# Patient Record
Sex: Female | Born: 1965 | Race: White | Hispanic: No | Smoking: Never smoker
Health system: Southern US, Community
[De-identification: ages and names within clinical notes are randomized; demographics above are authoritative.]

## PROBLEM LIST (undated history)

## (undated) DIAGNOSIS — J449 Chronic obstructive pulmonary disease, unspecified: Secondary | ICD-10-CM

## (undated) DIAGNOSIS — J45909 Unspecified asthma, uncomplicated: Secondary | ICD-10-CM

---

## 2018-01-23 ENCOUNTER — Encounter (HOSPITAL_COMMUNITY): Payer: Self-pay | Admitting: Behavioral Health

## 2018-01-23 ENCOUNTER — Inpatient Hospital Stay (HOSPITAL_COMMUNITY)
Admission: AD | Admit: 2018-01-23 | Discharge: 2018-01-26 | DRG: 885 | Disposition: A | Discharge status: Home / Self Care | Payer: Federal, State, Local not specified - Other | Source: Other Acute Inpatient Hospital | Attending: Psychiatry | Admitting: Psychiatry

## 2018-01-23 ENCOUNTER — Other Ambulatory Visit: Payer: Self-pay

## 2018-01-23 DIAGNOSIS — F419 Anxiety disorder, unspecified: Secondary | ICD-10-CM | POA: Diagnosis present

## 2018-01-23 DIAGNOSIS — Z7722 Contact with and (suspected) exposure to environmental tobacco smoke (acute) (chronic): Secondary | ICD-10-CM | POA: Diagnosis present

## 2018-01-23 DIAGNOSIS — Z818 Family history of other mental and behavioral disorders: Secondary | ICD-10-CM

## 2018-01-23 DIAGNOSIS — G4700 Insomnia, unspecified: Secondary | ICD-10-CM | POA: Diagnosis present

## 2018-01-23 DIAGNOSIS — Z880 Allergy status to penicillin: Secondary | ICD-10-CM

## 2018-01-23 DIAGNOSIS — J449 Chronic obstructive pulmonary disease, unspecified: Secondary | ICD-10-CM | POA: Diagnosis present

## 2018-01-23 DIAGNOSIS — Z79899 Other long term (current) drug therapy: Secondary | ICD-10-CM | POA: Diagnosis not present

## 2018-01-23 DIAGNOSIS — Z881 Allergy status to other antibiotic agents status: Secondary | ICD-10-CM | POA: Diagnosis not present

## 2018-01-23 DIAGNOSIS — F332 Major depressive disorder, recurrent severe without psychotic features: Principal | ICD-10-CM | POA: Diagnosis present

## 2018-01-23 DIAGNOSIS — Z560 Unemployment, unspecified: Secondary | ICD-10-CM

## 2018-01-23 DIAGNOSIS — T50902A Poisoning by unspecified drugs, medicaments and biological substances, intentional self-harm, initial encounter: Secondary | ICD-10-CM

## 2018-01-23 DIAGNOSIS — F4310 Post-traumatic stress disorder, unspecified: Secondary | ICD-10-CM | POA: Diagnosis present

## 2018-01-23 DIAGNOSIS — R059 Cough, unspecified: Secondary | ICD-10-CM

## 2018-01-23 DIAGNOSIS — R05 Cough: Secondary | ICD-10-CM

## 2018-01-23 DIAGNOSIS — Z915 Personal history of self-harm: Secondary | ICD-10-CM

## 2018-01-23 HISTORY — DX: Unspecified asthma, uncomplicated: J45.909

## 2018-01-23 HISTORY — DX: Chronic obstructive pulmonary disease, unspecified: J44.9

## 2018-01-23 MED ORDER — HYDROXYZINE HCL 25 MG PO TABS
25.0000 mg | ORAL_TABLET | Freq: Four times a day (QID) | ORAL | Status: DC | PRN
  Filled 2018-01-23: qty 10

## 2018-01-23 MED ORDER — ACETAMINOPHEN 325 MG PO TABS
650.0000 mg | ORAL_TABLET | Freq: Four times a day (QID) | ORAL | Status: DC | PRN
  Administered 2018-01-24: 650 mg via ORAL
  Filled 2018-01-23: qty 2

## 2018-01-23 MED ORDER — MAGNESIUM HYDROXIDE 400 MG/5ML PO SUSP: 30.0000 mL | Freq: Every day | ORAL | Status: DC | PRN

## 2018-01-23 MED ORDER — TRAZODONE HCL 50 MG PO TABS
50.0000 mg | ORAL_TABLET | Freq: Every evening | ORAL | Status: DC | PRN
  Filled 2018-01-23: qty 7

## 2018-01-23 MED ORDER — ALUM & MAG HYDROXIDE-SIMETH 200-200-20 MG/5ML PO SUSP: 30.0000 mL | ORAL | Status: DC | PRN

## 2018-01-23 NOTE — Progress Notes (Signed)
D: Pt was in dayroom upon initial approach.  Pt presents with depressed affect and mood.  Pt reports she is feeling "dizzy."  Her goal is "just to get cleaned up."  Pt denies SI/HI, denies hallucinations, denies pain.  Pt was encouraged to get some rest in her room because she was feeling dizzy.  She was excused from evening group.    A: Introduced self to pt.  Actively listened to pt and offered support and encouragement. Fall prevention techniques reviewed with pt and pt verbalized understanding.  BP and pulse checked see flowsheet.  PO fluids encouraged.  Q15 minute safety checks maintained.  R: Pt is safe on the unit.  Pt verbally contracts for safety.  Will continue to monitor and assess.

## 2018-01-23 NOTE — Tx Team (Signed)
Initial Treatment Plan 01/23/2018 6:13 PM Kirsten FreezeRosa Lee Stanley ZOX:[id number]    PATIENT STRESSORS: Loss of a child Marital or family conflict   PATIENT STRENGTHS: Ability for insight Capable of independent living   PATIENT IDENTIFIED PROBLEMS: "to change the way I look at life"  "to understand things a little better".                    DISCHARGE CRITERIA:  Ability to meet basic life and health needs Adequate post-discharge living arrangements Improved stabilization in mood, thinking, and/or behavior  PRELIMINARY DISCHARGE PLAN: Attend aftercare/continuing care group Attend PHP/IOP  PATIENT/FAMILY INVOLVEMENT: This treatment plan has been presented to and reviewed with the patient, Kirsten Stanley, and/or family member.  The patient and family have been given the opportunity to ask questions and make suggestions.  Layla BarterWhite, Janoah Menna L, RN 01/23/2018, 6:13 PM
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
## 2018-01-24 ENCOUNTER — Ambulatory Visit (HOSPITAL_COMMUNITY)
Admission: AD | Admit: 2018-01-24 | Discharge: 2018-01-24 | Disposition: A | Discharge status: Home / Self Care | Payer: Federal, State, Local not specified - Other | Source: Other Acute Inpatient Hospital | Attending: Psychiatry | Admitting: Psychiatry

## 2018-01-24 DIAGNOSIS — F332 Major depressive disorder, recurrent severe without psychotic features: Principal | ICD-10-CM

## 2018-01-24 DIAGNOSIS — T50902A Poisoning by unspecified drugs, medicaments and biological substances, intentional self-harm, initial encounter: Secondary | ICD-10-CM

## 2018-01-24 MED ORDER — CITALOPRAM HYDROBROMIDE 10 MG PO TABS
10.0000 mg | ORAL_TABLET | Freq: Every day | ORAL | Status: DC
  Administered 2018-01-24 – 2018-01-26 (×3): 10 mg via ORAL
  Filled 2018-01-24: qty 7
  Filled 2018-01-24: qty 1
  Filled 2018-01-24: qty 7
  Filled 2018-01-24 (×4): qty 1

## 2018-01-24 NOTE — Progress Notes (Signed)
D: Pt was in dayroom upon initial approach.  Pt presents with depressed affect and mood.  Describes her day as "good" and reports goal is "to be happy and think of things to look forward to."  Pt discussed events leading to her admission with Clinical research associatewriter.  She reports she had a good visit with her cousin tonight.  Pt denies SI/HI, denies hallucinations, denies pain.  Pt has been visible in milieu interacting with peers and staff appropriately.  Pt attended evening group.    A: Introduced self to pt.  Actively listened to pt and offered support and encouragement. Q15 minute safety checks maintained.  R: Pt is safe on the unit.  Pt verbally contracts for safety.  Will continue to monitor and assess.

## 2018-01-24 NOTE — [facility] Group Notes (Signed)
LCSW Group Therapy Note 01/24/2018 3:00 PM  Type of Therapy/Topic: Group Therapy: Feelings about Diagnosis  Participation Level: Minimal   Description of Group:  This group will allow patients to explore their thoughts and feelings about diagnoses they have received. Patients will be guided to explore their level of understanding and acceptance of these diagnoses. Facilitator will encourage patients to process their thoughts and feelings about the reactions of others to their diagnosis and will guide patients in identifying ways to discuss their diagnosis with significant others in their lives. This group will be process-oriented, with patients participating in exploration of their own experiences, giving and receiving support, and processing challenge from other group members.  Therapeutic Goals: 1. Patient will demonstrate understanding of diagnosis as evidenced by identifying two or more symptoms of the disorder 2. Patient will be able to express two feelings regarding the diagnosis 3. Patient will demonstrate their ability to communicate their needs through discussion and/or role play  Summary of Patient Progress:  Kirsten DieterRosa was attentive during the group session. Kirsten Stanley became tearful initially, however she chose not to contribute to the discussion so that she could control her emotions and not cry.     Therapeutic Modalities:  Cognitive Behavioral Therapy Brief Therapy Feelings Identification    Kirsten Stanley Kirsten AntiguaWilliams LCSWA Clinical Social Worker

## 2018-01-24 NOTE — Progress Notes (Addendum)
Pt left unit, ambulatory and in no acute distress[facility] [place] [facility]. Spoke with Berneice Heinrichina Tate RN, [facility], to confirm transport details.
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
## 2018-01-24 NOTE — [facility] Suicide Risk Assessment (Signed)
[facility] INPATIENT:  Family/Significant Other Suicide Prevention Education  Suicide Prevention Education:  Patient Refusal for Family/Significant Other Suicide Prevention Education: The patient Kirsten Stanley has refused to provide written consent for family/significant other to be provided Family/Significant Other Suicide Prevention Education during admission and/or prior to discharge.  Physician notified.  SPE completed with patient, as patient refused to consent to family contact. SPI pamphlet provided to pt and pt was encouraged to share information with support network, ask questions, and talk about any concerns relating to SPE. Patient denies access to guns/firearms and verbalized understanding of information provided. Mobile Crisis information also provided to patient.    [name] 01/24/2018, 10:19 AM

## 2018-01-24 NOTE — Progress Notes (Signed)
Adult Psychoeducational Group Note  Date:  01/24/2018 Time:  8:55 PM  Group Topic/Focus:  Wrap-Up Group:   The focus of this group is to help patients review their daily goal of treatment and discuss progress on daily workbooks.  Participation Level:  Active  Participation Quality:  Appropriate  Affect:  Appropriate  Cognitive:  Alert  Insight: Appropriate  Engagement in Group:  Engaged  Modes of Intervention:  Discussion  Additional Comments:  Patient stated having a pretty good day. Patient's goal for today was to be happy. Patient met goal.  Kirsten Stanley 01/24/2018, 8:55 PM

## 2018-01-25 LAB — BASIC METABOLIC PANEL
Anion gap: 8 (ref 5–15)
BUN: 10 mg/dL (ref 6–20)
CO2: 25 mmol/L (ref 22–32)
Calcium: 9.4 mg/dL (ref 8.9–10.3)
Chloride: 109 mmol/L (ref 98–111)
Creatinine, Ser: 1.04 mg/dL — ABNORMAL HIGH (ref 0.44–1.00)
Glucose, Bld: 96 mg/dL (ref 70–99)
POTASSIUM: 3.8 mmol/L (ref 3.5–5.1)
SODIUM: 142 mmol/L (ref 135–145)

## 2018-01-25 LAB — TSH: TSH: 2.911 u[IU]/mL (ref 0.350–4.500)

## 2018-01-25 LAB — CBC WITH DIFFERENTIAL/PLATELET
Basophils Absolute: 0 10*3/uL (ref 0.0–0.1)
Basophils Relative: 1 %
EOS PCT: 2 %
Eosinophils Absolute: 0.2 10*3/uL (ref 0.0–0.7)
HCT: 40.6 % (ref 36.0–46.0)
Hemoglobin: 13.3 g/dL (ref 12.0–15.0)
LYMPHS PCT: 35 %
Lymphs Abs: 2.4 10*3/uL (ref 0.7–4.0)
MCH: 29.6 pg (ref 26.0–34.0)
MCHC: 32.8 g/dL (ref 30.0–36.0)
MCV: 90.4 fL (ref 78.0–100.0)
Monocytes Absolute: 0.5 10*3/uL (ref 0.1–1.0)
Monocytes Relative: 7 %
Neutro Abs: 3.9 10*3/uL (ref 1.7–7.7)
Neutrophils Relative %: 55 %
PLATELETS: 309 10*3/uL (ref 150–400)
RBC: 4.49 MIL/uL (ref 3.87–5.11)
RDW: 12.9 % (ref 11.5–15.5)
WBC: 7 10*3/uL (ref 4.0–10.5)

## 2018-01-25 NOTE — Plan of Care (Signed)
  Problem: Medication: Goal: Compliance with prescribed medication regimen will improve Outcome: Progressing   Problem: Activity: Goal: Interest or engagement in leisure activities will improve Outcome: Progressing-pt compliant with attending scheduled groups.

## 2018-01-25 NOTE — [facility] Group Notes (Signed)
[facility] Therapy 01/25/2018 1:15pm  Type of Therapy: Mental Health Association Presentation  Participation Level: Active  Participation Quality: Attentive  Affect: Appropriate  Cognitive: Oriented  Insight: Developing/Improving  Engagement in Therapy: Engaged  Modes of Intervention: Discussion, Education and Socialization  Summary of Progress/Problems: Mental Health Association (MHA) Speaker came to talk about his personal journey with mental health. The pt processed ways by which to relate to the speaker. MHA speaker provided handouts and educational information pertaining to groups and services offered by the [facility]. Pt was engaged in speaker's presentation and was receptive to resources provided.    Lorri FrederickWierda, Trinika Cortese Jon, LCSW 01/25/2018 1:28 PM
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
## 2018-01-25 NOTE — Tx Team (Signed)
Interdisciplinary Treatment and Diagnostic Plan Update  01/25/2018 Time of Session: 0929 Izora Benn MRN: [id number]  Principal Diagnosis: <principal problem not specified>  Secondary Diagnoses: Active Problems:   MDD (major depressive disorder), recurrent episode, severe (HCC)   Current Medications:  Current Facility-Administered Medications  Medication Dose Route Frequency Provider Last Rate Last Dose  . acetaminophen (TYLENOL) tablet 650 mg  650 mg Oral Q6H PRN Fransisca Kaufmann A, NP   650 mg at 01/24/18 1311  . alum & mag hydroxide-simeth (MAALOX/MYLANTA) 200-200-20 MG/5ML suspension 30 mL  30 mL Oral Q4H PRN Fransisca Kaufmann A, NP      . citalopram (CELEXA) tablet 10 mg  10 mg Oral Daily Cobos, Rockey Situ, MD   10 mg at 01/25/18 5284  . hydrOXYzine (ATARAX/VISTARIL) tablet 25 mg  25 mg Oral Q6H PRN Fransisca Kaufmann A, NP      . magnesium hydroxide (MILK OF MAGNESIA) suspension 30 mL  30 mL Oral Daily PRN Thermon Leyland, NP      . traZODone (DESYREL) tablet 50 mg  50 mg Oral QHS PRN Thermon Leyland, NP       PTA Medications: Medications Prior to Admission  Medication Sig Dispense Refill Last Dose  . albuterol (PROAIR HFA) 108 (90 Base) MCG/ACT inhaler Inhale 1-2 puffs into the lungs every 6 (six) hours as needed for wheezing or shortness of breath.   01/22/2018  . albuterol (PROVENTIL) (2.5 MG/3ML) 0.083% nebulizer solution Take 2.5 mg by nebulization every 6 (six) hours as needed for wheezing or shortness of breath.   01/22/2018 at Unknown time  . tiotropium (SPIRIVA) 18 MCG inhalation capsule Place 18 mcg into inhaler and inhale daily.   01/22/2018    Patient Stressors: Loss of a child Marital or family conflict  Patient Strengths: Ability for insight Capable of independent living  Treatment Modalities: Medication Management, Group therapy, Case management,  1 to 1 session with clinician, Psychoeducation, Recreational therapy.   Physician Treatment Plan for Primary Diagnosis: <principal  problem not specified> Long Term Goal(s): Improvement in symptoms so as ready for discharge Improvement in symptoms so as ready for discharge   Short Term Goals: Ability to identify changes in lifestyle to reduce recurrence of condition will improve Ability to verbalize feelings will improve Ability to disclose and discuss suicidal ideas Ability to demonstrate self-control will improve Ability to identify and develop effective coping behaviors will improve Ability to maintain clinical measurements within normal limits will improve Ability to identify changes in lifestyle to reduce recurrence of condition will improve Ability to maintain clinical measurements within normal limits will improve  Medication Management: Evaluate patient's response, side effects, and tolerance of medication regimen.  Therapeutic Interventions: 1 to 1 sessions, Unit Group sessions and Medication administration.  Evaluation of Outcomes: Progressing  Physician Treatment Plan for Secondary Diagnosis: Active Problems:   MDD (major depressive disorder), recurrent episode, severe (HCC)  Long Term Goal(s): Improvement in symptoms so as ready for discharge Improvement in symptoms so as ready for discharge   Short Term Goals: Ability to identify changes in lifestyle to reduce recurrence of condition will improve Ability to verbalize feelings will improve Ability to disclose and discuss suicidal ideas Ability to demonstrate self-control will improve Ability to identify and develop effective coping behaviors will improve Ability to maintain clinical measurements within normal limits will improve Ability to identify changes in lifestyle to reduce recurrence of condition will improve Ability to maintain clinical measurements within normal limits will improve  Medication Management: Evaluate patient's response, side effects, and tolerance of medication regimen.  Therapeutic Interventions: 1 to 1 sessions, Unit  Group sessions and Medication administration.  Evaluation of Outcomes: Progressing   RN Treatment Plan for Primary Diagnosis: <principal problem not specified> Long Term Goal(s): Knowledge of disease and therapeutic regimen to maintain health will improve  Short Term Goals: Ability to identify and develop effective coping behaviors will improve and Compliance with prescribed medications will improve  Medication Management: RN will administer medications as ordered by provider, will assess and evaluate patient's response and provide education to patient for prescribed medication. RN will report any adverse and/or side effects to prescribing provider.  Therapeutic Interventions: 1 on 1 counseling sessions, Psychoeducation, Medication administration, Evaluate responses to treatment, Monitor vital signs and CBGs as ordered, Perform/monitor CIWA, COWS, AIMS and Fall Risk screenings as ordered, Perform wound care treatments as ordered.  Evaluation of Outcomes: Progressing   LCSW Treatment Plan for Primary Diagnosis: <principal problem not specified> Long Term Goal(s): Safe transition to appropriate next level of care at discharge, Engage patient in therapeutic group addressing interpersonal concerns.  Short Term Goals: Engage patient in aftercare planning with referrals and resources, Increase social support and Increase skills for wellness and recovery  Therapeutic Interventions: Assess for all discharge needs, 1 to 1 time with Social worker, Explore available resources and support systems, Assess for adequacy in community support network, Educate family and significant other(s) on suicide prevention, Complete Psychosocial Assessment, Interpersonal group therapy.  Evaluation of Outcomes: Progressing   Progress in Treatment: Attending groups: Yes. Participating in groups: Yes. Taking medication as prescribed: Yes. Toleration medication: Yes. Family/Significant other contact made: Yes,  individual(s) contacted:  with sister Patient understands diagnosis: Yes. Discussing patient identified problems/goals with staff: Yes. Medical problems stabilized or resolved: Yes. Denies suicidal/homicidal ideation: Yes. Issues/concerns per patient self-inventory: No. Other: none  New problem(s) identified: No, Describe:  none  New Short Term/Long Term Goal(s):  Patient Goals:  "to discharge quickly"  Discharge Plan or Barriers:   Reason for Continuation of Hospitalization: Depression Medication stabilization  Estimated Length of Stay: 2-4 days.  Attendees: Patient: Su HoffRosa Lemar 01/25/2018   Physician: Dr. Jama Flavorsobos, MD 01/25/2018   Nursing: Dewayne ShorterAlyssa Johnson, RN 01/25/2018   RN Care Manager: 01/25/2018   Social Worker: Daleen SquibbGreg Irmalee Riemenschneider, LCSW 01/25/2018   Recreational Therapist:  01/25/2018   Other:  01/25/2018   Other:  01/25/2018   Other: 01/25/2018     Scribe for Treatment Team: Lorri FrederickWierda, Monterio Bob Jon, LCSW 01/25/2018 1:54 PM

## 2018-01-25 NOTE — Therapy (Signed)
Occupational Therapy Group Note  Date:  01/25/2018 Time:  3:01 PM  Group Topic/Focus:  Stress Management  Participation Level:  Active  Participation Quality:  Appropriate  Affect:  Depressed and Flat  Cognitive:  Appropriate  Insight: Improving  Engagement in Group:  Engaged  Modes of Intervention:  Activity, Discussion, Education and Socialization  Additional Comments:    S: "Music is good for stress"  ?  O: Education given on healthy stress management, craft made as therapeutic activity to manage stress in relation to topic.Stress management tools worksheet discussed to differentiate negative vs positive coping skills. Pt asked to identify positive coping skills this date to use when reintegrating into community. Gratitude journal and adult coloring pages given at end of session.  ?  A: Pt presents to group with appropriate affect, engaged and participatory throughout session. Stress management craft made independently with increase in affect. Stress management tools worksheet completed, pt stating she enjoys music when she is stressed. Pt open to exploring various stress management strategies learned on stress management tools worksheet. Gratitude journal and adult coloring given at end of session.  ?  P: Pt provided with education on stress management activities to implement into daily routine. Handouts given to facilitate carryover when reintegrating into community.     Kirsten HandingKaylee Signe Stanley, MSOT, OTR/L  MooretonKaylee Fatim Stanley 01/25/2018, 3:01 PM

## 2018-01-25 NOTE — Progress Notes (Signed)
Pt presents with a flat affect and depressed mood. Pt appeared to be cautious and guarded during shift assessment. Pt denies SI/HI. Pt reported difficulty sleeping last night due to the temperature in the room. Pt reported decreased anxiety and depression today. Pt rated on her self inventory sheet: depression 2/10, anxiety 2/10 and hopelessness 2/10.  Pt stated goal for today, "be happy". Pt verbalized tolerating her meds well and denies any side effects.   Orders and medications reviewed with pt. V/s assessed. Pt encouraged to take sleep medication tonight, if she continues to have difficulty sleeping. Writer informed pt that she's allowed to change the thermostat in room to make the room temperature comfortable. Verbal support provided.   Pt encouraged to attend groups. 15 minute checks performed for safety.  Pt compliant with tx plan.

## 2018-01-25 NOTE — Progress Notes (Signed)
[facility] MD Progress Note  01/25/2018 9:32 AM Kirsten Stanley  MRN:  [id number] Subjective:  Patient reports she is feeling better today. Denies medication side effects. Objective : I have discussed case with treatment team and have met with patient. 52 year old female, presented to ED following overdose on unknown medications. History of depression , which has been intermittent and which she reports started about 9 years ago after death of her son. Today patient reports she is feeling " a lot better". Presents with improving mood and a fuller range of affect/vaguely anxious . Denies suicidal ideations today, contracts for safety on unit. Denies medication side effects. Currently on Celexa 10 mgrs QDAY . CX Ray  Negative- was ordered due to chronic dry cough (which she attributes to being exposed to second hand cigarette smoke). She has been visible on unit , going to groups.  Labs reviewed as below- unremarkable, TSH WNL at 2.911 Principal Problem: Depression Diagnosis:   Patient Active Problem List   Diagnosis Date Noted  . MDD (major depressive disorder), recurrent episode, severe ([place]) [F33.2] 01/23/2018   Total Time spent with patient: 20 minutes  Past Psychiatric History:  Past Medical History:  Past Medical History:  Diagnosis Date  . Asthma   . COPD (chronic obstructive pulmonary disease) ([place])    History reviewed. No pertinent surgical history. Family History: History reviewed. No pertinent family history. Family Psychiatric  History:  Social History:  Social History   Substance and Sexual Activity  Alcohol Use Not on file     Social History   Substance and Sexual Activity  Drug Use Not on file    Social History   Socioeconomic History  . Marital status: Divorced    Spouse name: Not on file  . Number of children: Not on file  . Years of education: Not on file  . Highest education level: Not on file  Occupational History  . Not on file  Social Needs  . Financial  resource strain: Not on file  . Food insecurity:    Worry: Not on file    Inability: Not on file  . Transportation needs:    Medical: Not on file    Non-medical: Not on file  Tobacco Use  . Smoking status: Never Smoker  . Smokeless tobacco: Never Used  Substance and Sexual Activity  . Alcohol use: Not on file  . Drug use: Not on file  . Sexual activity: Not on file  Lifestyle  . Physical activity:    Days per week: Not on file    Minutes per session: Not on file  . Stress: Not on file  Relationships  . Social connections:    Talks on phone: Not on file    Gets together: Not on file    Attends religious service: Not on file    Active member of club or organization: Not on file    Attends meetings of clubs or organizations: Not on file    Relationship status: Not on file  Other Topics Concern  . Not on file  Social History Narrative  . Not on file   Additional Social History:    Pain Medications: see MAR Prescriptions: see MAR Over the Counter: see MAR History of alcohol / drug use?: No history of alcohol / drug abuse  Sleep: Fair  Appetite:  Good  Current Medications: Current Facility-Administered Medications  Medication Dose Route Frequency Provider Last Rate Last Dose  . acetaminophen (TYLENOL) tablet 650 mg  650  mg Oral Q6H PRN Elmarie Shiley A, NP   650 mg at 01/24/18 1311  . alum & mag hydroxide-simeth (MAALOX/MYLANTA) 200-200-20 MG/5ML suspension 30 mL  30 mL Oral Q4H PRN Niel Hummer, NP      . citalopram (CELEXA) tablet 10 mg  10 mg Oral Daily Cobos, Myer Peer, MD   10 mg at 01/25/18 5852  . hydrOXYzine (ATARAX/VISTARIL) tablet 25 mg  25 mg Oral Q6H PRN Niel Hummer, NP      . magnesium hydroxide (MILK OF MAGNESIA) suspension 30 mL  30 mL Oral Daily PRN Niel Hummer, NP      . traZODone (DESYREL) tablet 50 mg  50 mg Oral QHS PRN Niel Hummer, NP        Lab Results:  Results for orders placed or performed during the hospital encounter of 01/23/18  (from the past 48 hour(s))  Basic metabolic panel     Status: Abnormal   Collection Time: 01/25/18  6:46 AM  Result Value Ref Range   Sodium 142 135 - 145 mmol/L   Potassium 3.8 3.5 - 5.1 mmol/L   Chloride 109 98 - 111 mmol/L   CO2 25 22 - 32 mmol/L   Glucose, Bld 96 70 - 99 mg/dL   BUN 10 6 - 20 mg/dL   Creatinine, Ser 1.04 (H) 0.44 - 1.00 mg/dL   Calcium 9.4 8.9 - 10.3 mg/dL   GFR calc non Af Amer >60 >60 mL/min   GFR calc Af Amer >60 >60 mL/min    Comment: (NOTE) The eGFR has been calculated using the CKD EPI equation. This calculation has not been validated in all clinical situations. eGFR's persistently <60 mL/min signify possible Chronic Kidney Disease.    Anion gap 8 5 - 15    Comment: Performed at [facility], [place] [address], [place], [place] [address]  CBC with Differential/Platelet     Status: None   Collection Time: 01/25/18  6:46 AM  Result Value Ref Range   WBC 7.0 4.0 - 10.5 K/uL   RBC 4.49 3.87 - 5.11 MIL/uL   Hemoglobin 13.3 12.0 - 15.0 g/dL   HCT 40.6 36.0 - 46.0 %   MCV 90.4 78.0 - 100.0 fL   MCH 29.6 26.0 - 34.0 pg   MCHC 32.8 30.0 - 36.0 g/dL   RDW 12.9 11.5 - 15.5 %   Platelets 309 150 - 400 K/uL   Neutrophils Relative % 55 %   Neutro Abs 3.9 1.7 - 7.7 K/uL   Lymphocytes Relative 35 %   Lymphs Abs 2.4 0.7 - 4.0 K/uL   Monocytes Relative 7 %   Monocytes Absolute 0.5 0.1 - 1.0 K/uL   Eosinophils Relative 2 %   Eosinophils Absolute 0.2 0.0 - 0.7 K/uL   Basophils Relative 1 %   Basophils Absolute 0.0 0.0 - 0.1 K/uL    Comment: Performed at [facility], [place] [address] [place], [place] [address]  TSH     Status: None   Collection Time: 01/25/18  6:46 AM  Result Value Ref Range   TSH 2.911 0.350 - 4.500 uIU/mL    Comment: Performed by a 3rd Generation assay with a functional sensitivity of <=0.01 uIU/mL. Performed at [facility], [place] [address] [place], [place] [address]     Blood Alcohol  level:  No results found for: [facility]  Metabolic Disorder Labs: No results found for: HGBA1C, MPG No results found for: PROLACTIN No  results found for: CHOL, TRIG, HDL, CHOLHDL, VLDL, LDLCALC  Physical Findings: AIMS: Facial and Oral Movements Muscles of Facial Expression: None, normal Lips and Perioral Area: None, normal Jaw: None, normal Tongue: None, normal,Extremity Movements Upper (arms, wrists, hands, fingers): None, normal Lower (legs, knees, ankles, toes): None, normal, Trunk Movements Neck, shoulders, hips: None, normal, Overall Severity Severity of abnormal movements (highest score from questions above): None, normal Incapacitation due to abnormal movements: None, normal Patient's awareness of abnormal movements (rate only patient's report): No Awareness, Dental Status Current problems with teeth and/or dentures?: No Does patient usually wear dentures?: No  CIWA:    COWS:     Musculoskeletal: Strength & Muscle Tone: within normal limits Gait & Station: normal Patient leans: N/A  Psychiatric Specialty Exam: Physical Exam  ROS denies headache , no chest pain, no shortness of breath, no vomiting  Blood pressure 119/88, pulse 93, temperature 98.2 F (36.8 C), temperature source Oral, resp. rate 18, height 5' 4"  (1.626 m), weight 79.8 kg (176 lb).Body mass index is 30.21 kg/m.  General Appearance: improving grooming   Eye Contact:  Good  Speech:  Garbled  Volume:  Normal  Mood:  reports she is feeling better  Affect:  appropriate, reactive, anxious   Thought Process:  Linear and Descriptions of Associations: Intact  Orientation:  Other:  fully alert and attentive  Thought Content:  denies hallucinations,no delusions, not internally preoccupied   Suicidal Thoughts:  No denies suicidal or self injurious ideations, denies homicidal or violent ideations  Homicidal Thoughts:  No  Memory:  recent and remote grossly intact   Judgement:  Other:  fair- improving   Insight:   fair- improving   Psychomotor Activity:  Normal  Concentration:  Concentration: Good and Attention Span: Good  Recall:  Good  Fund of Knowledge:  Good  Language:  Good  Akathisia:  Negative  Handed:  Right  AIMS (if indicated):     Assets:  Desire for Improvement Resilience  ADL's:  Intact  Cognition:  WNL  Sleep:  Number of Hours: 6.75    Assessment - 52 year old female, admitted via [place] ED following overdose on unknown medications, which she reports was impulsive , triggered by BF having an accidental opiate overdose and requiring Narcan and history of losing a son 9 years ago. At this time reports feeling better, less depressed, denies suicidal ideations at this time. Tolerating medications well thus far ( On Celexa ) .  Treatment Plan Summary: Daily contact with patient to assess and evaluate symptoms and progress in treatment, Medication management, Plan inpatient treatment  and medications as below Encourage group and milieu participation to work on coping skills and symptom reduction. Continue Celexa 10 mgrs QDAY for depression, anxiety Continue Vistaril 25 mgrs Q 6 hours PRN for anxiety Continue Trazodone 50 mgrs QHS PRN for insomnia Treatment team working on disposition planning options.  Jenne Campus, MD 01/25/2018, 9:32 AM

## 2018-01-26 ENCOUNTER — Encounter (HOSPITAL_COMMUNITY): Payer: Self-pay | Admitting: Behavioral Health

## 2018-01-26 DIAGNOSIS — T50902A Poisoning by unspecified drugs, medicaments and biological substances, intentional self-harm, initial encounter: Secondary | ICD-10-CM

## 2018-01-26 MED ORDER — HYDROXYZINE HCL 25 MG PO TABS: 25.0000 mg | ORAL_TABLET | Freq: Four times a day (QID) | ORAL | 0 refills | Status: AC | PRN

## 2018-01-26 MED ORDER — CITALOPRAM HYDROBROMIDE 10 MG PO TABS: 10.0000 mg | ORAL_TABLET | Freq: Every day | ORAL | 0 refills | Status: AC

## 2018-01-26 MED ORDER — TRAZODONE HCL 50 MG PO TABS: 50.0000 mg | ORAL_TABLET | Freq: Every evening | ORAL | 0 refills | Status: AC | PRN

## 2018-01-26 NOTE — Discharge Summary (Addendum)
Physician Discharge Summary Note  Patient:  Kirsten [facility] is an 52 y.o., female MRN:  [id number] DOB:  09/28/1965 Patient phone:  [phone] (home)  Patient address:   [address] [place] [address],  Total Time spent with patient: 30 minutes  Date of Admission:  01/23/2018 Date of Discharge: 01/26/2018  Reason for Admission:  status post overdose.  Principal Problem: MDD (major depressive disorder), recurrent episode, severe [facility]) Discharge Diagnoses: Patient Active Problem List   Diagnosis Date Noted  . MDD (major depressive disorder), recurrent episode, severe (HCC) [F33.2] 01/23/2018    Past Psychiatric History: no prior psychiatric admissions, denies history of self injurious behaviors , denies history of suicide attempts, denies history of psychosis, denies history of mania, denies history of panic attacks , but endorses mild agoraphobia. Reports history of depression, mainly when his son died 9 years ago. Reports history of PTSD as above, improved overtime, with some residual intermittent symptoms as above.  Past Medical History:  Past Medical History:  Diagnosis Date  . Asthma   . COPD (chronic obstructive pulmonary disease) (HCC)    History reviewed. No pertinent surgical history. Family History: History reviewed. No pertinent family history. Family Psychiatric  History: Reports mother has history of depression, suicidal ideations in the past, brother abuses methamphetamine, sister abuses opiates . Social History:  Social History   Substance and Sexual Activity  Alcohol Use Not on file     Social History   Substance and Sexual Activity  Drug Use Not on file    Social History   Socioeconomic History  . Marital status: Divorced    Spouse name: Not on file  . Number of children: Not on file  . Years of education: Not on file  . Highest education level: Not on file  Occupational History  . Not on file  Social Needs  . Financial resource strain:  Not on file  . Food insecurity:    Worry: Not on file    Inability: Not on file  . Transportation needs:    Medical: Not on file    Non-medical: Not on file  Tobacco Use  . Smoking status: Never Smoker  . Smokeless tobacco: Never Used  Substance and Sexual Activity  . Alcohol use: Not on file  . Drug use: Not on file  . Sexual activity: Not on file  Lifestyle  . Physical activity:    Days per week: Not on file    Minutes per session: Not on file  . Stress: Not on file  Relationships  . Social connections:    Talks on phone: Not on file    Gets together: Not on file    Attends religious service: Not on file    Active member of club or organization: Not on file    Attends meetings of clubs or organizations: Not on file    Relationship status: Not on file  Other Topics Concern  . Not on file  Social History Narrative  . Not on file    Hospital Course:  Patient is a 52 year old female, lives with BF, unemployed. She presented voluntarily to [facility] yesterday 8/5. She states she impulsively overdosed on " some medications". States this was impulsive, unplanned , " spur of the moment". She states " I am not sure what I took, it was a handful". States medications were her sister's and that there were different tablets and medications in the bottle. States she vomited soon after overdose. After she  overdosed she states she asked a friend to take her to ED.  Patient reports BF abuses heroin and earlier that day he  had an accidental opiate overdose which required her to resuscitate him. " I had to  give him Narcan". States that " after that he was OK and left as if nothing had happened ". States " I just felt like no one cared about me. It also made me think of my son who died ".  States that prior to above event she had some mild depressive symptoms, but had not had any suicidal ideations. Endorses some neuro-vegetative symptoms of depression as below.  [facility] was started on medication  regimen for presenting symptoms. She was medicated & discharged on;   Celexa 10 mgrs QDAY for depression, anxiety Vistaril 25 mgrs Q 6 hours PRN for anxiety Trazodone 50 mgrs QHS PRN for insomnia  Patient has been adherent with treatment recommendations. Patient tolerated the medications without any reported side effects are adverse reactions.  Patient was enrolled & participated in the group counseling sessions being offerred & held on this unit. Patient learned coping skills.  Kirsten Stanley is seen today by the attending psychiatrist for discharge. Patient denies any delusions, no hallucinations or other psychotic process. Patient denies active or passive suicidal thoughts. No thoughts of violence. No craving for drugs. Endorses overall improvement in mood emotional state.    Nursing staff reports that patient has been appropriate on the unit. Patient has been interacting well with peers. No behavioral issues. Patient has not voiced any suicidal thoughts. Prior to discharge patient was discussed at the treatment team meeting this morning. Team members feels that patient is back to her baseline level of functioning. Team agrees with plan to discharge patient today. Patient was provided with all follow-up information to resume mental health treatment following discharge as noted below. [facility] was provided with a prescription for her [facility] discharge medications.  Patient left [facility] with all personal belongings in no apparent distress. Transportation per patient/ family arrangement.    Labs: TSH, CBC NORMAL. BMP showed creatinine, ser 1.04 otherwise normal.   Physical Findings: AIMS: Facial and Oral Movements Muscles of Facial Expression: None, normal Lips and Perioral Area: None, normal Jaw: None, normal Tongue: None, normal,Extremity Movements Upper (arms, wrists, hands, fingers): None, normal Lower (legs, knees, ankles, toes): None, normal, Trunk Movements Neck, shoulders, hips: None, normal, Overall  Severity Severity of abnormal movements (highest score from questions above): None, normal Incapacitation due to abnormal movements: None, normal Patient's awareness of abnormal movements (rate only patient's report): No Awareness, Dental Status Current problems with teeth and/or dentures?: No Does patient usually wear dentures?: No  CIWA:    COWS:     Musculoskeletal: Strength & Muscle Tone: within normal limits Gait & Station: normal Patient leans: N/A  Psychiatric Specialty Exam: SEE SRA BY MD  Physical Exam  Nursing note and vitals reviewed. Constitutional: She is oriented to person, place, and time.  Neurological: She is alert and oriented to person, place, and time.    Review of Systems  Psychiatric/Behavioral: Negative for hallucinations, memory loss, substance abuse and suicidal ideas. Depression: stable. Nervous/anxious: stable. Insomnia: stable.   All other systems reviewed and are negative.   Blood pressure (!) 157/93, pulse 83, temperature 98.4 F (36.9 C), temperature source Oral, resp. rate 16, height 5\' 4"  (1.626 m), weight 79.8 kg.Body mass index is 30.21 kg/m.    Have you used any form of tobacco in the last 30 days? (Cigarettes,  Smokeless Tobacco, Cigars, and/or Pipes): No  Has this patient used any form of tobacco in the last 30 days? (Cigarettes, Smokeless Tobacco, Cigars, and/or Pipes)  N/A  Blood Alcohol level:  No results found for: [facility]  Metabolic Disorder Labs:  No results found for: HGBA1C, MPG No results found for: PROLACTIN No results found for: CHOL, TRIG, HDL, CHOLHDL, VLDL, LDLCALC  See Psychiatric Specialty Exam and Suicide Risk Assessment completed by Attending Physician prior to discharge.  Discharge destination:  Home  Is patient on multiple antipsychotic therapies at discharge:  No   Has Patient had three or more failed trials of antipsychotic monotherapy by history:  No  Recommended Plan for Multiple Antipsychotic  Therapies: NA  Discharge Instructions    Discharge instructions   Complete by:  As directed    Discharge Recommendations:  The patient is being discharged to her family. Patient is to take her discharge medications as ordered.  See follow up above. We recommend that she participate in individual therapy to target depression, anxiety, suicidal thoughts and improving coping skills.  We recommend that she get AIMS scale, height, weight, blood pressure, fasting lipid panel, fasting blood sugar in three months from discharge as she is on atypical antipsychotics. Patient will benefit from monitoring of recurrence suicidal ideation since patient is on antidepressant medication. The patient should abstain from all illicit substances and alcohol.  If the patient's symptoms worsen or do not continue to improve or if the patient becomes actively suicidal or homicidal then it is recommended that the patient return to the closest hospital emergency room or call 911 for further evaluation and treatment.  National Suicide Prevention Lifeline 1800-SUICIDE or 91656376051800-937-375-4692. Please follow up with your primary medical doctor for all other medical needs.  The patient has been educated on the possible side effects to medications and she/her guardian is to contact a medical professional and inform outpatient provider of any new side effects of medication. She is to take regular diet and activity as tolerated.  Patient would benefit from a daily moderate exercise. Family was educated about removing/locking any firearms, medications or dangerous products from the home.     Allergies as of 01/26/2018      Reactions   Amoxicillin Anaphylaxis      Penicillins    Has patient had a PCN reaction causing immediate rash, facial/tongue/throat swelling, SOB or lightheadedness with hypotension: Yes Has patient had a PCN reaction causing severe rash involving mucus membranes or skin necrosis: Yes Has patient had a PCN reaction  that required hospitalization: Yes Has patient had a PCN reaction occurring within the last 10 years: No If all of the above answers are "NO", then may proceed with Cephalosporin use.      Medication List    TAKE these medications     Indication  albuterol (2.5 MG/3ML) 0.083% nebulizer solution Commonly known as:  PROVENTIL Take 2.5 mg by nebulization every 6 (six) hours as needed for wheezing or shortness of breath.  Indication:  shortness of breath   PROAIR HFA 108 (90 Base) MCG/ACT inhaler Generic drug:  albuterol Inhale 1-2 puffs into the lungs every 6 (six) hours as needed for wheezing or shortness of breath.  Indication:  Asthma   citalopram 10 MG tablet Commonly known as:  CELEXA Take 1 tablet (10 mg total) by mouth daily. Start taking on:  01/27/2018  Indication:  Depression   hydrOXYzine 25 MG tablet Commonly known as:  ATARAX/VISTARIL Take 1 tablet (25 mg total) by  mouth every 6 (six) hours as needed for anxiety.  Indication:  Feeling Anxious   tiotropium 18 MCG inhalation capsule Commonly known as:  SPIRIVA Place 18 mcg into inhaler and inhale daily.  Indication:  asthma   traZODone 50 MG tablet Commonly known as:  DESYREL Take 1 tablet (50 mg total) by mouth at bedtime as needed for sleep.  Indication:  Trouble Sleeping        Follow-up recommendations:  Follow up with your outpatient provided for any medical issues. Activity & diet as recommended by your primary care provider.  Comments:  SEE DISCHARGE INSTRUCTIONS ABOVE.  Signed: Denzil Magnuson, NP 01/26/2018, 8:56 AM   Patient seen, Suicide Assessment Completed.  Disposition Plan Reviewed

## 2018-01-26 NOTE — [facility] Suicide Risk Assessment (Signed)
[facility]   Principal Problem: MDD (major depressive disorder), recurrent episode, severe (HCC) Discharge Diagnoses:  Patient Active Problem List   Diagnosis Date Noted  . MDD (major depressive disorder), recurrent episode, severe (HCC) [F33.2] 01/23/2018    Total Time spent with patient: 30 minutes  Musculoskeletal: Strength & Muscle Tone: within normal limits Gait & Station: normal Patient leans: N/A  Psychiatric Specialty Exam: ROS- no headache, no chest pain, no shortness of breath, no vomiting, reports dry cough, which has been improving.  Blood pressure (!) 157/93, pulse 83, temperature 98.4 F (36.9 C), temperature source Oral, resp. rate 16, height 5\' 4"  (1.626 m), weight 79.8 kg.Body mass index is 30.21 kg/m.  General Appearance: improving grooming   Eye Contact::  Good  Speech:  Normal Rate409  Volume:  Normal  Mood:  improving , reports " I am feeling a little better"  Affect:  Appropriate and reactive  Thought Process:  Linear and Descriptions of Associations: Intact  Orientation:  Full (Time, Place, and Person)  Thought Content:  no hallucinations, no delusions,not internally preoccupied   Suicidal Thoughts:  No denies suicidal or self injurious ideations, no homicidal or violent ideations   Homicidal Thoughts:  No  Memory:  recent and remote grossly intact   Judgement:  Other:  improving   Insight:  improving   Psychomotor Activity:  Normal  Concentration:  Good  Recall:  Good  Fund of Knowledge:Good  Language: Good  Akathisia:  Negative  Handed:  Right  AIMS (if indicated):     Assets:  Communication Skills Desire for Improvement Resilience  Sleep:  Number of Hours: 6.5  Cognition: WNL  ADL's:  Intact   Mental Status Per Nursing Assessment::   On Admission:  Suicidal ideation indicated by patient  Demographic Factors:  52 year old married female, three children ( one son deceased )  Loss Factors: SO recently  accidentally overdosed, son passed away years ago   Historical Factors: No prior psychiatric admissions, no history of prior suicide attempts, history of depression and anxiety  Risk Reduction Factors:   Sense of responsibility to family, Living with another person, especially a relative and Positive coping skills or problem solving skills  Continued Clinical Symptoms:  Currently alert, attentive, well related, pleasant, mood improved, states " I feel a lot better", affect appropriate, reactive. No thought disorder, no suicidal or self injurious ideations. No homicidal or violent ideations. No hallucinations, no delusions. Denies medication side effects. Behavior on unit calm , interacting appropriately with peers, pleasant on approach. Patient states her husband and her have opted to separate at this time, she plans to return home, states he has moved out .  Cognitive Features That Contribute To Risk:  No gross cognitive deficits noted upon discharge. Is alert , attentive, and oriented x 3   Suicide Risk:  Mild:  Suicidal ideation of limited frequency, intensity, duration, and specificity.  There are no identifiable plans, no associated intent, mild dysphoria and related symptoms, good self-control (both objective and subjective assessment), few other risk factors, and identifiable protective factors, including available and accessible social support.  Follow-up Information    [facility]   Why:  Please attend your appt on Monday, 01/30/18, at 10:15am.  Please bring photo ID, insurance card, and your medication with you. Contact information: [address] [place] [id number] [phone]           Plan Of Care/Follow-up recommendations:  Activity:  as tolerated  Diet:  regular Tests:  NA Other:  See below  Patient is expressing readiness for discharge. She is leaving unit in good spirits . Plans to return home. Follow up as above  .   Craige Cotta, MD 01/26/2018, 10:23 AM

## 2018-01-26 NOTE — Progress Notes (Signed)
  [facility] :  Will you be returning to the same living situation after discharge:  Yes,  with boyfriend At discharge, do you have transportation home?: Yes,  friend Do you have the ability to pay for your medications: No. [facility]  Release of information consent forms completed and in the chart;  Patient's signature needed at discharge.  Patient to Follow up ZO:[id number].  01/30/18.   Next [facility] care [facility] Link:no  Safety Planning and Suicide Prevention discussed: No. Pt declined consent, SPE completed with pt.  Have you used any form of tobacco in the last 30 days? (Cigarettes, Smokeless Tobacco, Cigars, and/or Pipes): No  Has patient been referred to the Quitline?: N/A patient is not a smoker  Patient has been referred for addiction treatment: N/A  Lorri FrederickWierda, Omeka Holben Jon, LCSW 01/26/2018, 9:59 AM

## 2018-01-26 NOTE — Progress Notes (Signed)
Pt received both written and verbal discharge instructions. Pt verbalized understanding of discharge instructions. Pt agreed to f/u appt and med regimen. Pt received sample meds, prescriptions and d/c packet. Pt gathered belongings from room and locker. Pt safely discharged to the lobby. 

## 2019-05-04 IMAGING — CR DG CHEST 2V
2 series · 2 of 2 positions shown · non-contrast
Comparison: 12/06/2011 chest CT

CLINICAL DATA: Cough and shortness of breath

EXAM:
CHEST - 2 VIEW

[w chest pa]
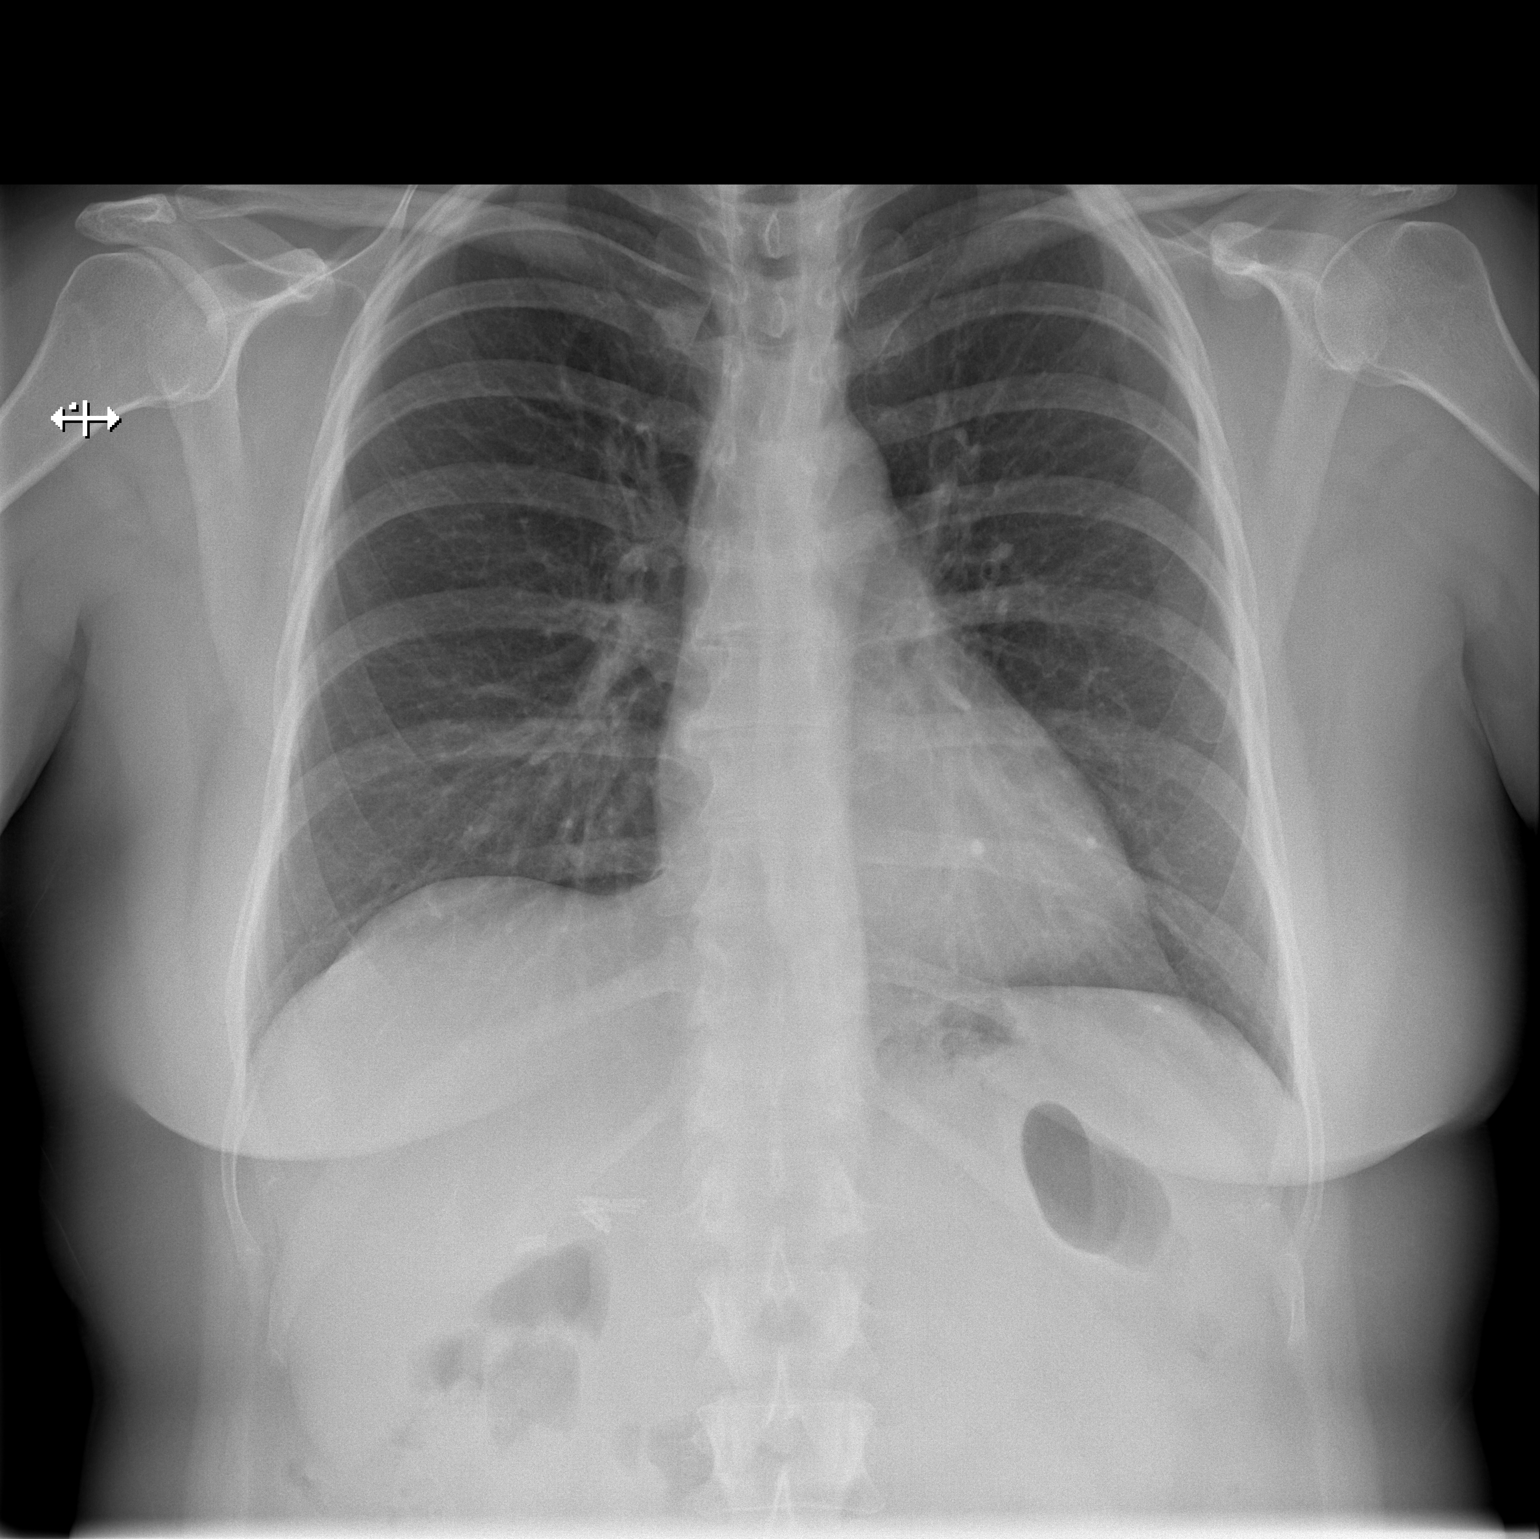

[w chest lat]
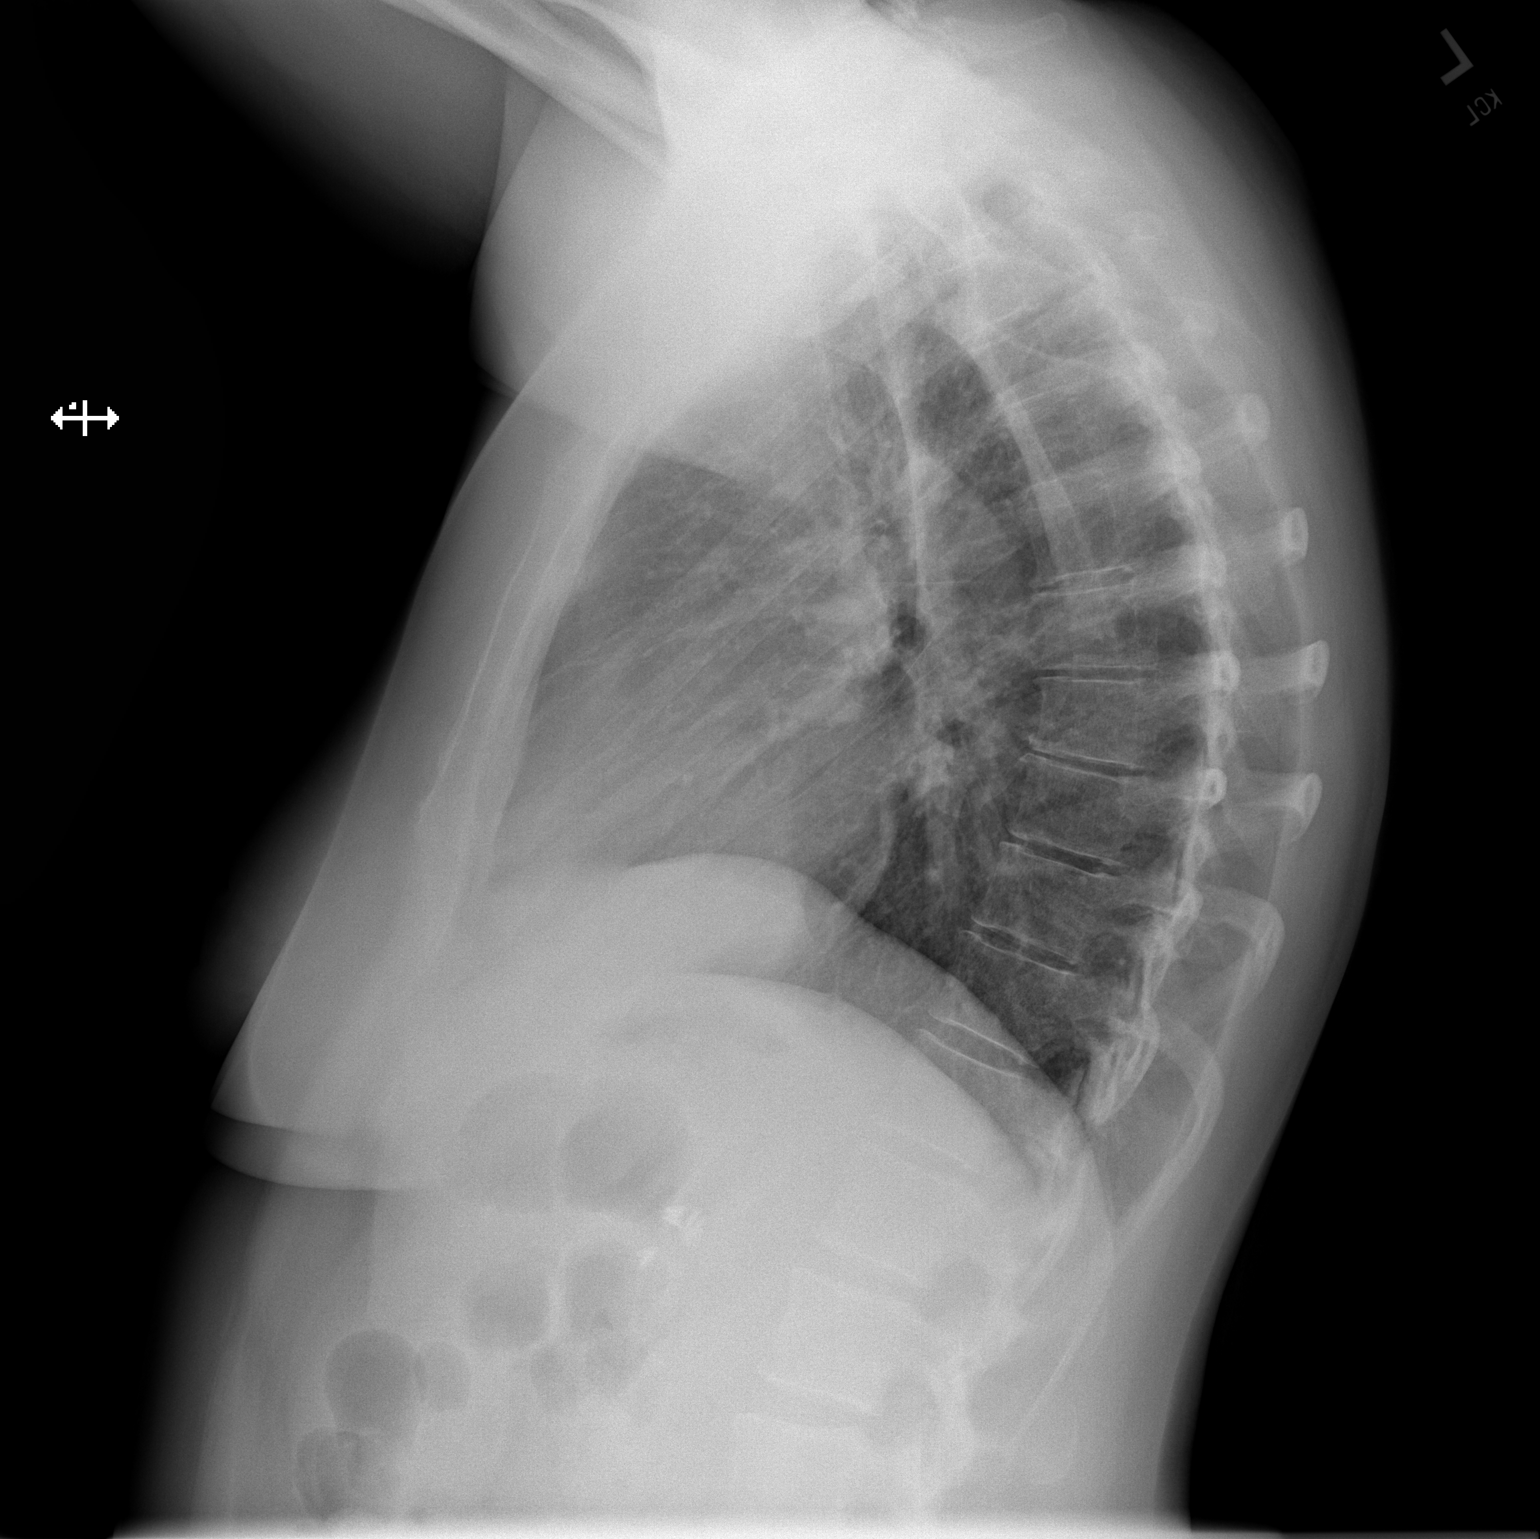

[2 of 2 positions shown; findings below may reference images not displayed]

FINDINGS: Normal heart size and mediastinal contours. No acute infiltrate or
edema. No effusion or pneumothorax. No acute osseous findings.
Cholecystectomy clips
IMPRESSION: Negative chest.
